# Patient Record
Sex: Male | Born: 1965 | Race: Black or African American | Hispanic: No | Marital: Single | Smoking: Heavy tobacco smoker
Health system: Southern US, Community
[De-identification: ages and names within clinical notes are randomized; demographics above are authoritative.]

## PROBLEM LIST (undated history)

## (undated) DIAGNOSIS — G8929 Other chronic pain: Secondary | ICD-10-CM

## (undated) DIAGNOSIS — M25569 Pain in unspecified knee: Secondary | ICD-10-CM

---

## 2001-10-30 ENCOUNTER — Emergency Department (HOSPITAL_COMMUNITY): Admission: EM | Admit: 2001-10-30 | Discharge: 2001-10-30 | Payer: Self-pay | Admitting: *Deleted

## 2003-09-23 ENCOUNTER — Emergency Department (HOSPITAL_COMMUNITY): Admission: EM | Admit: 2003-09-23 | Discharge: 2003-09-23 | Payer: Self-pay | Admitting: Emergency Medicine

## 2006-01-30 ENCOUNTER — Emergency Department (HOSPITAL_COMMUNITY): Admission: EM | Admit: 2006-01-30 | Discharge: 2006-01-30 | Payer: Self-pay | Admitting: Emergency Medicine

## 2006-03-06 ENCOUNTER — Emergency Department (HOSPITAL_COMMUNITY): Admission: EM | Admit: 2006-03-06 | Discharge: 2006-03-06 | Payer: Self-pay | Admitting: Emergency Medicine

## 2006-03-27 ENCOUNTER — Emergency Department (HOSPITAL_COMMUNITY): Admission: EM | Admit: 2006-03-27 | Discharge: 2006-03-27 | Payer: Self-pay | Admitting: Emergency Medicine

## 2006-03-28 ENCOUNTER — Emergency Department (HOSPITAL_COMMUNITY): Admission: EM | Admit: 2006-03-28 | Discharge: 2006-03-28 | Payer: Self-pay | Admitting: Emergency Medicine

## 2006-03-29 ENCOUNTER — Encounter (HOSPITAL_COMMUNITY): Admission: RE | Admit: 2006-03-29 | Discharge: 2006-04-28 | Payer: Self-pay | Admitting: General Surgery

## 2007-02-20 ENCOUNTER — Emergency Department (HOSPITAL_COMMUNITY): Admission: EM | Admit: 2007-02-20 | Discharge: 2007-02-20 | Payer: Self-pay | Admitting: Emergency Medicine

## 2008-05-25 ENCOUNTER — Emergency Department (HOSPITAL_COMMUNITY): Admission: EM | Admit: 2008-05-25 | Discharge: 2008-05-25 | Payer: Self-pay | Admitting: Emergency Medicine

## 2009-06-02 ENCOUNTER — Emergency Department (HOSPITAL_COMMUNITY): Admission: EM | Admit: 2009-06-02 | Discharge: 2009-06-02 | Payer: Self-pay | Admitting: Emergency Medicine

## 2011-07-21 LAB — CBC
HCT: 43.6
Hemoglobin: 14.3
MCHC: 32.8
MCV: 84.5
RBC: 5.16
RDW: 14.2

## 2011-07-21 LAB — DIFFERENTIAL
Basophils Relative: 1
Eosinophils Absolute: 0
Lymphs Abs: 1.8
Monocytes Relative: 9
Neutro Abs: 2.5
Neutrophils Relative %: 52

## 2011-07-21 LAB — COMPREHENSIVE METABOLIC PANEL
Albumin: 3.8
Alkaline Phosphatase: 56
BUN: 15
Creatinine, Ser: 1.43
Glucose, Bld: 104 — ABNORMAL HIGH
Total Protein: 6.6

## 2011-07-21 LAB — URINALYSIS, ROUTINE W REFLEX MICROSCOPIC
Bilirubin Urine: NEGATIVE
Glucose, UA: NEGATIVE
Hgb urine dipstick: NEGATIVE
Ketones, ur: NEGATIVE
Specific Gravity, Urine: 1.025
pH: 6

## 2011-07-21 LAB — LIPASE, BLOOD: Lipase: 21

## 2014-11-03 ENCOUNTER — Encounter (HOSPITAL_COMMUNITY): Payer: Self-pay | Admitting: Cardiology

## 2014-11-03 ENCOUNTER — Emergency Department (HOSPITAL_COMMUNITY)
Admission: EM | Admit: 2014-11-03 | Discharge: 2014-11-03 | Disposition: A | Discharge status: Home / Self Care | Payer: BLUE CROSS/BLUE SHIELD | Attending: Emergency Medicine | Admitting: Emergency Medicine

## 2014-11-03 DIAGNOSIS — Z720 Tobacco use: Secondary | ICD-10-CM | POA: Insufficient documentation

## 2014-11-03 DIAGNOSIS — Y9367 Activity, basketball: Secondary | ICD-10-CM | POA: Diagnosis not present

## 2014-11-03 DIAGNOSIS — S76012A Strain of muscle, fascia and tendon of left hip, initial encounter: Secondary | ICD-10-CM | POA: Insufficient documentation

## 2014-11-03 DIAGNOSIS — Y92310 Basketball court as the place of occurrence of the external cause: Secondary | ICD-10-CM | POA: Diagnosis not present

## 2014-11-03 DIAGNOSIS — X58XXXA Exposure to other specified factors, initial encounter: Secondary | ICD-10-CM | POA: Diagnosis not present

## 2014-11-03 DIAGNOSIS — T148XXA Other injury of unspecified body region, initial encounter: Secondary | ICD-10-CM

## 2014-11-03 DIAGNOSIS — Y998 Other external cause status: Secondary | ICD-10-CM | POA: Diagnosis not present

## 2014-11-03 DIAGNOSIS — S79912A Unspecified injury of left hip, initial encounter: Secondary | ICD-10-CM | POA: Diagnosis present

## 2014-11-03 MED ORDER — TRAMADOL HCL 50 MG PO TABS: 50.0000 mg | ORAL_TABLET | Freq: Four times a day (QID) | ORAL | Status: DC | PRN

## 2014-11-03 NOTE — ED Notes (Signed)
C/o pain to lower back,  Left hip and both knees since playing basketball Friday.

## 2014-11-03 NOTE — ED Provider Notes (Signed)
CSN: [id number]     Arrival date & time 11/03/14  0802 History   First MD Initiated Contact with Patient 11/03/14 0813     Chief Complaint  Patient presents with  . Back Pain     (Consider location/radiation/quality/duration/timing/severity/associated sxs/prior Treatment) HPI Comments: Pt comes in with c/o  Lower back, left hip and bilateral knee pain for 4 days. Pt states that he was playing basketball with a "bunch of 50 year old" and "i was doing things I had no business doing". He states that he hasn't taken anything for the symptoms. Denies numbness, weakness or incontinence. States that he has full rom  The history is provided by the patient. No language interpreter was used.    History reviewed. No pertinent past medical history. History reviewed. No pertinent past surgical history. History reviewed. No pertinent family history. History  Substance Use Topics  . Smoking status: Light Tobacco Smoker  . Smokeless tobacco: Not on file  . Alcohol Use: Yes     Comment: occasional    Review of Systems  All other systems reviewed and are negative.     Allergies  Review of patient's allergies indicates no known allergies.  Home Medications   Prior to Admission medications   Not on File   BP 129/89 mmHg  Pulse 86  Temp(Src) 98.3 F (36.8 C) (Oral)  Resp 18  Ht 6\' 3"  (1.905 m)  Wt 205 lb (92.987 kg)  BMI 25.62 kg/m2  SpO2 98% Physical Exam  Constitutional: He is oriented to person, place, and time. He appears well-developed and well-nourished.  Cardiovascular: Normal rate and regular rhythm.   Pulmonary/Chest: Effort normal and breath sounds normal.  Musculoskeletal: Normal range of motion.  Tender to the left lateral hip. No shortening or rotation. Pt has full rom. No swelling or deformity to the knees  Neurological: He is alert and oriented to person, place, and time. He exhibits normal muscle tone. Coordination normal.  Skin: Skin is warm and dry.  Psychiatric:  He has a normal mood and affect.  Nursing note and vitals reviewed.   ED Course  Procedures (including critical care time) Labs Review Labs Reviewed - No data to display  Imaging Review No results found.   EKG Interpretation None      MDM   Final diagnoses:  Muscle strain    dont think bony injury involved. Likely soreness related to overuse.considered rhabdo but doubt. Will give Leim Fabryultram    Doroteo Nickolson, NP 11/03/14 [id number]  Samuel JesterKathleen McManus, DO 11/05/14 1341

## 2014-11-03 NOTE — Discharge Instructions (Signed)

## 2016-02-10 ENCOUNTER — Emergency Department (HOSPITAL_COMMUNITY)
Admission: EM | Admit: 2016-02-10 | Discharge: 2016-02-10 | Disposition: A | Discharge status: Home / Self Care | Payer: Self-pay | Attending: Emergency Medicine | Admitting: Emergency Medicine

## 2016-02-10 ENCOUNTER — Encounter (HOSPITAL_COMMUNITY): Payer: Self-pay

## 2016-02-10 ENCOUNTER — Emergency Department (HOSPITAL_COMMUNITY): Payer: Self-pay

## 2016-02-10 DIAGNOSIS — F17210 Nicotine dependence, cigarettes, uncomplicated: Secondary | ICD-10-CM | POA: Insufficient documentation

## 2016-02-10 DIAGNOSIS — M2540 Effusion, unspecified joint: Secondary | ICD-10-CM

## 2016-02-10 DIAGNOSIS — M25462 Effusion, left knee: Secondary | ICD-10-CM | POA: Insufficient documentation

## 2016-02-10 DIAGNOSIS — M25562 Pain in left knee: Secondary | ICD-10-CM

## 2016-02-10 MED ORDER — HYDROCODONE-ACETAMINOPHEN 5-325 MG PO TABS: 1.0000 | ORAL_TABLET | Freq: Four times a day (QID) | ORAL | Status: DC | PRN

## 2016-02-10 MED ORDER — IBUPROFEN 800 MG PO TABS
800.0000 mg | ORAL_TABLET | Freq: Once | ORAL | Status: AC
  Administered 2016-02-10: 800 mg via ORAL
  Filled 2016-02-10: qty 1

## 2016-02-10 MED ORDER — HYDROCODONE-ACETAMINOPHEN 5-325 MG PO TABS
2.0000 | ORAL_TABLET | Freq: Once | ORAL | Status: AC
  Administered 2016-02-10: 2 via ORAL
  Filled 2016-02-10: qty 2

## 2016-02-10 MED ORDER — IBUPROFEN 800 MG PO TABS
800.0000 mg | ORAL_TABLET | Freq: Three times a day (TID) | ORAL | Status: DC | PRN
Start: 2016-02-10 — End: 2016-03-13

## 2016-02-10 NOTE — ED Notes (Signed)
Pt reports pain and swelling to his left knee x 1 day, denies recent injury or trauma, states "it just sometimes swells"

## 2016-02-10 NOTE — ED Provider Notes (Addendum)
TIME SEEN: 6:30 AM  CHIEF COMPLAINT: Left knee pain  HPI: Pt is a 50 y.o. male with history of tobacco use who presents to the emergency department with left knee pain and swelling for the past day. No known injury to the left knee. He states he has had problems with pain and swelling in this knee before and has never seen a physician for it. Does not have an orthopedic doctor. States that sometimes it will swell with over use like walking, playing basketball. Denies numbness, tingling or focal weakness. No history of PE or DVT. No redness or warmth. Not on anticoagulation. Denies any fever.  ROS: See HPI Constitutional: no fever  Eyes: no drainage  ENT: no runny nose   Cardiovascular:  no chest pain  Resp: no SOB  GI: no vomiting GU: no dysuria Integumentary: no rash  Allergy: no hives  Musculoskeletal: no leg swelling  Neurological: no slurred speech ROS otherwise negative  PAST MEDICAL HISTORY/PAST SURGICAL HISTORY:  History reviewed. No pertinent past medical history.  MEDICATIONS:  Prior to Admission medications   Medication Sig Start Date End Date Taking? Authorizing Provider  traMADol (ULTRAM) 50 MG tablet Take 1 tablet (50 mg total) by mouth every 6 (six) hours as needed. 11/03/14   Teressa LowerVrinda Pickering, NP    ALLERGIES:  No Known Allergies  SOCIAL HISTORY:  Social History  Substance Use Topics  . Smoking status: Light Tobacco Smoker  . Smokeless tobacco: Not on file  . Alcohol Use: Yes     Comment: occasional    FAMILY HISTORY: No family history on file.  EXAM: BP 117/69 mmHg  Pulse 62  Temp(Src) 98.5 F (36.9 C) (Oral)  Resp 18  Ht 6\' 2"  (1.88 m)  Wt 195 lb (88.451 kg)  BMI 25.03 kg/m2  SpO2 100% CONSTITUTIONAL: Alert and oriented and responds appropriately to questions. Well-appearing; well-nourished HEAD: Normocephalic EYES: Conjunctivae clear, PERRL ENT: normal nose; no rhinorrhea; moist mucous membranes NECK: Supple, no meningismus, no LAD  CARD:  RRR; S1 and S2 appreciated; no murmurs, no clicks, no rubs, no gallops RESP: Normal chest excursion without splinting or tachypnea; breath sounds clear and equal bilaterally; no wheezes, no rhonchi, no rales, no hypoxia or respiratory distress, speaking full sentences ABD/GI: Normal bowel sounds; non-distended; soft, non-tender, no rebound, no guarding, no peritoneal signs BACK:  The back appears normal and is non-tender to palpation, there is no CVA tenderness EXT: Patient has tenderness over the medial lateral joint line of the left knee with a moderate joint effusion. There is absolutely no erythema, warmth. He has no ligamentous laxity on exam. He does have pain with flexion of the knee but has full range of motion in this joint. No bony tenderness on exam or bony deformity. 2+ DP pulses bilaterally. Normal ROM in all joints; otherwise extreme his arm non-tender to palpation; no edema; normal capillary refill; no cyanosis, no calf tenderness or swelling; compartments are soft    SKIN: Normal color for age and race; warm; no rash NEURO: Moves all extremities equally, sensation to light touch intact diffusely, cranial nerves II through XII intact PSYCH: The patient's mood and manner are appropriate. Grooming and personal hygiene are appropriate.  MEDICAL DECISION MAKING: Patient here with left knee pain and moderate joint effusion. Suspect arthritis, meniscal injury, sprain. Nothing to suggest septic arthritis. Doubt DVT. Neurovascular intact distally. We'll obtain an x-ray as he has never had an x-ray of this knee before. Will give crutches to help him ambulate.  We'll place an Ace wrap to help with swelling.  Have advised him to buy a knee sleeve.  I advised him to keep it elevated, apply ice Will treat pain with Vicodin, ibuprofen. Anticipate follow-up with an orthopedic physician as an outpatient.  ED PROGRESS: X-ray shows minimal arthritic changes and large joint effusion. Again nothing to suggest  septic arthritis, gout or hemarthrosis. Suspect effusion is reactive from sprain, arthritic changes. Have recommended close outpatient orthopedic follow-up. Have recommended ice, elevation, rest, NSAIDs. Discussed return precautions.  We'll discharge with Vicodin, ibuprofen. He is comfortable with this plan.     At this time, I do not feel there is any life-threatening condition present. I have reviewed and discussed all results (EKG, imaging, lab, urine as appropriate), exam findings with patient. I have reviewed nursing notes and appropriate previous records.  I feel the patient is safe to be discharged home without further emergent workup. Discussed usual and customary return precautions. Patient and family (if present) verbalize understanding and are comfortable with this plan.  Patient will follow-up with their primary care provider. If they do not have a primary care provider, information for follow-up has been provided to them. All questions have been answered.       Layla Maw Fannye Myer, DO 02/10/16 [phone]

## 2016-02-10 NOTE — ED Notes (Signed)
MD at bedside. 

## 2016-02-10 NOTE — Discharge Instructions (Signed)
How to Use a Knee Brace °A knee brace is a device that you wear to support your knee, especially if the knee is healing after an injury or surgery. There are several types of knee braces. Some are designed to prevent an injury (prophylactic brace). These are often worn during sports. Others support an injured knee (functional brace) or keep it still while it heals (rehabilitative brace). People with severe arthritis of the knee may benefit from a brace that takes some pressure off the knee (unloader brace). Most knee braces are made from a combination of cloth and metal or plastic.  °You may need to wear a knee brace to: °· Relieve knee pain. °· Help your knee support your weight (improve stability). °· Help you walk farther (improve mobility). °· Prevent injury. °· Support your knee while it heals from surgery or from an injury. °RISKS AND COMPLICATIONS °Generally, knee braces are very safe to wear. However, problems may occur, including: °· Skin irritation that may lead to infection. °· Making your condition worse if you wear the brace in the wrong way. °HOW TO USE A KNEE BRACE °Different braces will have different instructions for use. Your health care provider will tell you or show you: °· How to put on your brace. °· How to adjust the brace. °· When and how often to wear the brace. °· How to remove the brace. °· If you will need any assistive devices in addition to the brace, such as crutches or a cane. °In general, your brace should: °· Have the hinge of the brace line up with the bend of your knee. °· Have straps, hooks, or tapes that fasten snugly around your leg. °· Not feel too tight or too loose.  °HOW TO CARE FOR A KNEE BRACE °· Check your brace often for signs of damage, such as loose connections or attachments. Your knee brace may get damaged or wear out during normal use. °· Wash the fabric parts of your brace with soap and water. °· Read the insert that comes with your brace for other specific care  instructions.  °SEEK MEDICAL CARE IF: °· Your knee brace is too loose or too tight and you cannot adjust it. °· Your knee brace causes skin redness, swelling, bruising, or irritation. °· Your knee brace is not helping. °· Your knee brace is making your knee pain worse. °  °This information is not intended to replace advice given to you by your health care provider. Make sure you discuss any questions you have with your health care provider. °  °Document Released: 12/30/2003 Document Revised: 06/30/2015 Document Reviewed: 02/01/2015 °Elsevier Interactive Patient Education ©2016 [employer] ° °Knee Pain °Knee pain is a very common symptom and can have many causes. Knee pain often goes away when you follow your health care provider's instructions for relieving pain and discomfort at home. However, knee pain can develop into a condition that needs treatment. Some conditions may include: °· Arthritis caused by wear and tear (osteoarthritis). °· Arthritis caused by swelling and irritation (rheumatoid arthritis or gout). °· A cyst or growth in your knee. °· An infection in your knee joint. °· An injury that will not heal. °· Damage, swelling, or irritation of the tissues that support your knee (torn ligaments or tendinitis). °If your knee pain continues, additional tests may be ordered to diagnose your condition. Tests may include X-rays or other imaging studies of your knee. You may also need to have fluid removed from your knee. Treatment for   ongoing knee pain depends on the cause, but treatment may include: °· Medicines to relieve pain or swelling. °· Steroid injections in your knee. °· Physical therapy. °· Surgery. °HOME CARE INSTRUCTIONS °· Take medicines only as directed by your health care provider. °· Rest your knee and keep it raised (elevated) while you are resting. °· Do not do things that cause or worsen pain. °· Avoid high-impact activities or exercises, such as running, jumping rope, or doing jumping  jacks. °· Apply ice to the knee area: °· Put ice in a plastic bag. °· Place a towel between your skin and the bag. °· Leave the ice on for 20 minutes, 2-3 times a day. °· Ask your health care provider if you should wear an elastic knee support. °· Keep a pillow under your knee when you sleep. °· Lose weight if you are overweight. Extra weight can put pressure on your knee. °· Do not use any tobacco products, including cigarettes, chewing tobacco, or electronic cigarettes. If you need help quitting, ask your health care provider. Smoking may slow the healing of any bone and joint problems that you may have. °SEEK MEDICAL CARE IF: °· Your knee pain continues, changes, or gets worse. °· You have a fever along with knee pain. °· Your knee buckles or locks up. °· Your knee becomes more swollen. °SEEK IMMEDIATE MEDICAL CARE IF:  °· Your knee joint feels hot to the touch. °· You have chest pain or trouble breathing. °  °This information is not intended to replace advice given to you by your health care provider. Make sure you discuss any questions you have with your health care provider. °  °Document Released: 08/06/2007 Document Revised: 10/30/2014 Document Reviewed: 05/25/2014 °Elsevier Interactive Patient Education ©2016 [employer] ° ° °RICE for Routine Care of Injuries °The routine care of many injuries includes rest, ice, compression, and elevation (RICE therapy). RICE therapy is often recommended for injuries to soft tissues, such as a muscle strain, ligament injuries, bruises, and overuse injuries. It can also be used for some bony injuries. Using RICE therapy can help to relieve pain, lessen swelling, and enable your body to heal. °Rest °Rest is required to allow your body to heal. This usually involves reducing your normal activities and avoiding use of the injured part of your body. Generally, you can return to your normal activities when you are comfortable and have been given permission by your health care  provider. °Ice °Icing your injury helps to keep the swelling down, and it lessens pain. Do not apply ice directly to your skin. °· Put ice in a plastic bag. °· Place a towel between your skin and the bag. °· Leave the ice on for 20 minutes, 2-3 times a day. °Do this for as long as you are directed by your health care provider. °Compression °Compression means putting pressure on the injured area. Compression helps to keep swelling down, gives support, and helps with discomfort. Compression may be done with an elastic bandage. If an elastic bandage has been applied, follow these general tips: °· Remove and reapply the bandage every 3-4 hours or as directed by your health care provider. °· Make sure the bandage is not wrapped too tightly, because this can cut off circulation. If part of your body beyond the bandage becomes blue, numb, cold, swollen, or more painful, your bandage is most likely too tight. If this occurs, remove your bandage and reapply it more loosely. °· See your health care provider if the bandage seems to be   making your problems worse rather than better. °Elevation °Elevation means keeping the injured area raised. This helps to lessen swelling and decrease pain. If possible, your injured area should be elevated at or above the level of your heart or the center of your chest. °WHEN SHOULD I SEEK MEDICAL CARE? °You should seek medical care if: °· Your pain and swelling continue. °· Your symptoms are getting worse rather than improving. °These symptoms may indicate that further evaluation or further X-rays are needed. Sometimes, X-rays may not show a small broken bone (fracture) until a number of days later. Make a follow-up appointment with your health care provider. °WHEN SHOULD I SEEK IMMEDIATE MEDICAL CARE? °You should seek immediate medical care if: °· You have sudden severe pain at or below the area of your injury. °· You have redness or increased swelling around your injury. °· You have tingling  or numbness at or below the area of your injury that does not improve after you remove the elastic bandage. °  °This information is not intended to replace advice given to you by your health care provider. Make sure you discuss any questions you have with your health care provider. °  °Document Released: 01/21/2001 Document Revised: 06/30/2015 Document Reviewed: 09/16/2014 °Elsevier Interactive Patient Education ©2016 [employer] ° °

## 2016-03-13 ENCOUNTER — Encounter (HOSPITAL_COMMUNITY): Payer: Self-pay

## 2016-03-13 ENCOUNTER — Emergency Department (HOSPITAL_COMMUNITY)
Admission: EM | Admit: 2016-03-13 | Discharge: 2016-03-13 | Disposition: A | Discharge status: Home / Self Care | Payer: BLUE CROSS/BLUE SHIELD | Attending: Emergency Medicine | Admitting: Emergency Medicine

## 2016-03-13 DIAGNOSIS — Z791 Long term (current) use of non-steroidal anti-inflammatories (NSAID): Secondary | ICD-10-CM | POA: Insufficient documentation

## 2016-03-13 DIAGNOSIS — F17210 Nicotine dependence, cigarettes, uncomplicated: Secondary | ICD-10-CM | POA: Insufficient documentation

## 2016-03-13 DIAGNOSIS — Z7982 Long term (current) use of aspirin: Secondary | ICD-10-CM | POA: Insufficient documentation

## 2016-03-13 DIAGNOSIS — M25462 Effusion, left knee: Secondary | ICD-10-CM | POA: Insufficient documentation

## 2016-03-13 MED ORDER — ONDANSETRON HCL 4 MG PO TABS
4.0000 mg | ORAL_TABLET | Freq: Once | ORAL | Status: AC
  Administered 2016-03-13: 4 mg via ORAL
  Filled 2016-03-13: qty 1

## 2016-03-13 MED ORDER — HYDROCODONE-ACETAMINOPHEN 5-325 MG PO TABS
2.0000 | ORAL_TABLET | Freq: Once | ORAL | Status: AC
  Administered 2016-03-13: 2 via ORAL
  Filled 2016-03-13: qty 2

## 2016-03-13 MED ORDER — DEXAMETHASONE SODIUM PHOSPHATE 4 MG/ML IJ SOLN
4.0000 mg | Freq: Once | INTRAMUSCULAR | Status: AC
  Administered 2016-03-13: 4 mg via INTRA_ARTICULAR
  Filled 2016-03-13: qty 1

## 2016-03-13 MED ORDER — BUPIVACAINE HCL (PF) 0.25 % IJ SOLN
10.0000 mL | Freq: Once | INTRAMUSCULAR | Status: AC
  Administered 2016-03-13: 10 mL
  Filled 2016-03-13: qty 30

## 2016-03-13 MED ORDER — IBUPROFEN 800 MG PO TABS
800.0000 mg | ORAL_TABLET | Freq: Once | ORAL | Status: AC
  Administered 2016-03-13: 800 mg via ORAL
  Filled 2016-03-13: qty 1

## 2016-03-13 MED ORDER — HYDROCODONE-ACETAMINOPHEN 5-325 MG PO TABS: 1.0000 | ORAL_TABLET | ORAL | Status: AC | PRN

## 2016-03-13 MED ORDER — IBUPROFEN 800 MG PO TABS: 800.0000 mg | ORAL_TABLET | Freq: Three times a day (TID) | ORAL | Status: AC

## 2016-03-13 NOTE — ED Notes (Signed)
Pt alert & oriented x4, stable gait. Patient given discharge instructions, paperwork & prescription(s). Patient informed not to drive, operate any equipment & handel any important documents 4 hours after taking pain medication. Patient  instructed to stop at the registration desk to finish any additional paperwork. Patient  verbalized understanding. Pt left department w/ no further questions. 

## 2016-03-13 NOTE — Discharge Instructions (Signed)
Knee Effusion PLEASE SEE DR HARRISON OR THE ORTHOPEDIC OF YOUR CHOICE FOR EVALUATION OF YOUR KNEE SWELLING/EFFUSION. USE DECADRON AND DICLOFENAC DAILY WITH FOOD.  Knee Effusion Knee effusion means that you have extra fluid in your knee. This can cause pain. Your knee may be more difficult to bend and move. HOME CARE  Use crutches as told by your doctor.  Wear a knee brace as told by your doctor.  Apply ice to the swollen area:  Put ice in a plastic bag.  Place a towel between your skin and the bag.  Leave the ice on for 20 minutes, 2-3 times per day.  Keep your knee raised (elevated) when you are sitting or lying down.  Take medicines only as told by your doctor.  Do any rehabilitation or strengthening exercises as told by your doctor.  Rest your knee as told by your doctor. You may start doing your normal activities again when your doctor says it is okay.  Keep all follow-up visits as told by your doctor. This is important. GET HELP IF:   You continue to have pain in your knee. GET HELP RIGHT AWAY IF:  You have increased swelling or redness of your knee.  You have severe pain in your knee.  You have a fever.   This information is not intended to replace advice given to you by your health care provider. Make sure you discuss any questions you have with your health care provider.   Document Released: 11/11/2010 Document Revised: 10/30/2014 Document Reviewed: 05/25/2014 Elsevier Interactive Patient Education 2016 [employer] Knee effusion means that you have extra fluid in your knee. This can cause pain. Your knee may be more difficult to bend and move. HOME CARE  Use crutches as told by your doctor.  Wear a knee brace as told by your doctor.  Apply ice to the swollen area:  Put ice in a plastic bag.  Place a towel between your skin and the bag.  Leave the ice on for 20 minutes, 2-3 times per day.  Keep your knee raised (elevated) when you are sitting or  lying down.  Take medicines only as told by your doctor.  Do any rehabilitation or strengthening exercises as told by your doctor.  Rest your knee as told by your doctor. You may start doing your normal activities again when your doctor says it is okay.  Keep all follow-up visits as told by your doctor. This is important. GET HELP IF:   You continue to have pain in your knee. GET HELP RIGHT AWAY IF:  You have increased swelling or redness of your knee.  You have severe pain in your knee.  You have a fever.   This information is not intended to replace advice given to you by your health care provider. Make sure you discuss any questions you have with your health care provider.   Document Released: 11/11/2010 Document Revised: 10/30/2014 Document Reviewed: 05/25/2014 Elsevier Interactive Patient Education [employer]

## 2016-03-13 NOTE — ED Provider Notes (Signed)
CSN: [id number]     Arrival date & time 03/13/16  1731 History  By signing my name below, I, Mesha Guinyard, attest that this documentation has been prepared under the direction and in the presence of Treatment Team:  Attending Provider: Vanetta MuldersScott Zackowski, MD Physician Assistant: Ivery QualeHobson Malyiah Fellows, PA-C.  Electronically Signed: Arvilla MarketMesha Guinyard, Medical Scribe. 03/13/2016. 6:28 PM.   Chief Complaint  Patient presents with  . Knee Pain   The history is provided by the patient. No language interpreter was used.    HPI Comments: Marco Perez is a 50 y.o. 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 He states that he is having associated symptoms of edema, and pain. Pt was referred to an orthopedic in the past but never went because he didn't have insurance or the money for it. Pt hasn't had any injury to his knee since the last time he was seen.   History reviewed. No pertinent past medical history. History reviewed. No pertinent past surgical history. No family history on file. Social History  Substance Use Topics  . Smoking status: Heavy Tobacco Smoker -- 0.50 packs/day    Types: Cigarettes  . Smokeless tobacco: None  . Alcohol Use: Yes     Comment: occasional    Review of Systems  Musculoskeletal: Positive for joint swelling and arthralgias.  All other systems reviewed and are negative.   Allergies  Review of patient's allergies indicates no known allergies.  Home Medications   Prior to Admission medications   Medication Sig Start Date End Date Taking? Authorizing Provider  aspirin EC 325 MG tablet Take 325 mg by mouth every 6 (six) hours as needed for mild pain or moderate pain.   Yes Historical Provider, MD  HYDROcodone-acetaminophen (NORCO/VICODIN) 5-325 MG tablet Take 1-2 tablets by mouth every 6 (six) hours as needed. Patient not taking: Reported on  03/13/2016 02/10/16   Kristen N Ward, DO  ibuprofen (ADVIL,MOTRIN) 800 MG tablet Take 1 tablet (800 mg total) by mouth every 8 (eight) hours as needed for mild pain. Patient not taking: Reported on 03/13/2016 02/10/16   Kristen N Ward, DO   BP 120/57 mmHg  Pulse 103  Temp(Src) 98.7 F (37.1 C) (Oral)  Resp 16  Ht 6\' 2"  (1.88 m)  Wt 198 lb (89.812 kg)  BMI 25.41 kg/m2  SpO2 100% Physical Exam  Constitutional: He is oriented to person, place, and time. He appears well-developed and well-nourished.  Non-toxic appearance.  HENT:  Head: Normocephalic.  Right Ear: Tympanic membrane and external ear normal.  Left Ear: Tympanic membrane and external ear normal.  Eyes: EOM and lids are normal. Pupils are equal, round, and reactive to light.  Neck: Normal range of motion. Neck supple. Carotid bruit is not present.  Cardiovascular: Normal rate, regular rhythm, normal heart sounds, intact distal pulses and normal pulses.   Pulmonary/Chest: Breath sounds normal. No respiratory distress.  Abdominal: Soft. Bowel sounds are normal. There is no tenderness. There is no guarding.  Musculoskeletal: Normal range of motion.  Left knee is warm but not hot  Effusion present Patella is in the mid line No posterior mass No deformity of the patellar tendon, or the quadricep area Dorsalis pedis is 2+  Lymphadenopathy:       Head (right side): No submandibular adenopathy present.       Head (left side): No submandibular  adenopathy present.    He has no cervical adenopathy.  Neurological: He is alert and oriented to person, place, and time. He has normal strength. No cranial nerve deficit or sensory deficit.  Skin: Skin is warm and dry.  Psychiatric: He has a normal mood and affect. His speech is normal.  Nursing note and vitals reviewed.   ED Course Attempt to drain the effusion of the knee had to be aborted, pt not cooperative.  Procedures  DIAGNOSTIC STUDIES: Oxygen Saturation is 100% on RA, NL by my  interpretation.    COORDINATION OF CARE: 6:28 PM Discussed treatment plan with pt at bedside and pt agreed to plan.  MDM Effusion of the left knee present. I reviewed the Xray of April 2017 and found pt to have effusion and medial and lateral compartment narrowing present. Pt again encouraged to see orthopedic for evaluation as soon as possible. Rx for norco and ibuprofen given. Pt treated with IM decadron.   Final diagnoses:  Knee effusion, left    **I have reviewed nursing notes, vital signs, and all appropriate lab and imaging results for this patient.*  **I personally performed the services described in this documentation, which was scribed in my presence. The recorded information has been reviewed and is accurate.Ivery Quale, PA-C 03/13/16 2033  Vanetta Mulders, MD 03/14/16 [phone]

## 2016-03-13 NOTE — ED Notes (Signed)
Pt reports left knee started hurting 3 weeks ago, but pain increased 2 days ago. Difficulty bearing weight. No known injury. Utilizing crutches

## 2016-03-13 NOTE — ED Notes (Signed)
Pt returned to ER for left knee pain again. No new injury. Did not follow up w/ ortho, states he has no money for follow up. Wanting something for pain. Pt still using crutches but does not have immobilizer on at present.

## 2016-03-14 ENCOUNTER — Telehealth: Payer: Self-pay | Admitting: Orthopedic Surgery

## 2016-03-14 NOTE — Telephone Encounter (Signed)
Patient called this morning and wanted to schedule an appointment.  I offered later this week but he said he has no money now and doesn't want to come this week.  I offered several days and he said he could only come on Wednesdays or Friday.  I again offered dates and he requested to wait until March 29, 2016 at 3":40 with Dr. Romeo AppleHarrison.  I will mail out paperwork today

## 2016-03-15 ENCOUNTER — Emergency Department (HOSPITAL_COMMUNITY)
Admission: EM | Admit: 2016-03-15 | Discharge: 2016-03-15 | Disposition: A | Discharge status: Home / Self Care | Payer: Self-pay | Attending: Emergency Medicine | Admitting: Emergency Medicine

## 2016-03-15 ENCOUNTER — Encounter (HOSPITAL_COMMUNITY): Payer: Self-pay | Admitting: Emergency Medicine

## 2016-03-15 DIAGNOSIS — F17210 Nicotine dependence, cigarettes, uncomplicated: Secondary | ICD-10-CM | POA: Insufficient documentation

## 2016-03-15 DIAGNOSIS — Z791 Long term (current) use of non-steroidal anti-inflammatories (NSAID): Secondary | ICD-10-CM | POA: Insufficient documentation

## 2016-03-15 DIAGNOSIS — Z7982 Long term (current) use of aspirin: Secondary | ICD-10-CM | POA: Insufficient documentation

## 2016-03-15 DIAGNOSIS — M25462 Effusion, left knee: Secondary | ICD-10-CM | POA: Insufficient documentation

## 2016-03-15 DIAGNOSIS — Z0289 Encounter for other administrative examinations: Secondary | ICD-10-CM | POA: Insufficient documentation

## 2016-03-15 HISTORY — DX: Pain in unspecified knee: M25.569

## 2016-03-15 HISTORY — DX: Other chronic pain: G89.29

## 2016-03-15 NOTE — ED Notes (Signed)
Pt c/o continued left knee pain. Pt here for extended work note to return on Monday.

## 2016-03-15 NOTE — Discharge Instructions (Signed)
Please continue to use the knee immobilizer and crutches on. Please continue to use the ibuprofen every 6 hours for inflammation, swelling, and pain. Please see the orthopedic specialist as scheduled. Knee Effusion Knee effusion means that you have extra fluid in your knee. This can cause pain. Your knee may be more difficult to bend and move. HOME CARE  Use crutches as told by your doctor.  Wear a knee brace as told by your doctor.  Apply ice to the swollen area:  Put ice in a plastic bag.  Place a towel between your skin and the bag.  Leave the ice on for 20 minutes, 2-3 times per day.  Keep your knee raised (elevated) when you are sitting or lying down.  Take medicines only as told by your doctor.  Do any rehabilitation or strengthening exercises as told by your doctor.  Rest your knee as told by your doctor. You may start doing your normal activities again when your doctor says it is okay.  Keep all follow-up visits as told by your doctor. This is important. GET HELP IF:   You continue to have pain in your knee. GET HELP RIGHT AWAY IF:  You have increased swelling or redness of your knee.  You have severe pain in your knee.  You have a fever.   This information is not intended to replace advice given to you by your health care provider. Make sure you discuss any questions you have with your health care provider.   Document Released: 11/11/2010 Document Revised: 10/30/2014 Document Reviewed: 05/25/2014 Elsevier Interactive Patient Education [employer]2016 [employer]

## 2016-03-15 NOTE — ED Provider Notes (Signed)
CSN: [id number]     Arrival date & time 03/15/16  1743 History   First MD Initiated Contact with Patient 03/15/16 1851     Chief Complaint  Patient presents with  . Knee Pain     (Consider location/radiation/quality/duration/timing/severity/associated sxs/prior Treatment) HPI Comments: Patient is a 50 year old male who presents to the emergency department with a complaint of knee pain, and "I need an extension on my work excuse".  The patient was seen in the emergency department on May 22, at which time he was noted to have an effusion of the left knee. His x-rays were assessed from previous visits, and it is noted that he has osteoarthritis involving the knee. The thumb patient was strongly encouraged to see orthopedics for evaluation of the on effusion. The patient states that he is not able to go to the doctor's office tubal weeks. He requests to have a extension of his work note from the previous emergency department visit. There's been no fever or chills reported. No red streaks from the knee. The patient is using a crutch to get around.  Patient is a 50 y.o. male presenting with knee pain. The history is provided by the patient.  Knee Pain Location:  Knee Knee location:  L knee   Past Medical History  Diagnosis Date  . Chronic knee pain    History reviewed. No pertinent past surgical history. No family history on file. Social History  Substance Use Topics  . Smoking status: Heavy Tobacco Smoker -- 0.50 packs/day    Types: Cigarettes  . Smokeless tobacco: None  . Alcohol Use: Yes     Comment: occasional    Review of Systems    Allergies  Review of patient's allergies indicates no known allergies.  Home Medications   Prior to Admission medications   Medication Sig Start Date End Date Taking? Authorizing Provider  aspirin EC 325 MG tablet Take 325 mg by mouth every 6 (six) hours as needed for mild pain or moderate pain.    Historical Provider, MD   HYDROcodone-acetaminophen (NORCO/VICODIN) 5-325 MG tablet Take 1 tablet by mouth every 4 (four) hours as needed. 03/13/16   Ivery QualeHobson Xena Propst, PA-C  ibuprofen (ADVIL,MOTRIN) 800 MG tablet Take 1 tablet (800 mg total) by mouth 3 (three) times daily. 03/13/16   Ivery QualeHobson Dashanti Burr, PA-C   BP 117/65 mmHg  Pulse 80  Temp(Src) 98.6 F (37 C) (Oral)  Resp 18  Ht 6\' 2"  (1.88 m)  Wt 88.451 kg  BMI 25.03 kg/m2  SpO2 100% Physical Exam  Constitutional: He is oriented to person, place, and time. He appears well-developed and well-nourished.  Non-toxic appearance.  HENT:  Head: Normocephalic.  Right Ear: Tympanic membrane and external ear normal.  Left Ear: Tympanic membrane and external ear normal.  Eyes: EOM and lids are normal. Pupils are equal, round, and reactive to light.  Neck: Normal range of motion. Neck supple. Carotid bruit is not present.  Cardiovascular: Normal rate, regular rhythm, normal heart sounds, intact distal pulses and normal pulses.   Pulmonary/Chest: Breath sounds normal. No respiratory distress.  Abdominal: Soft. Bowel sounds are normal. There is no tenderness. There is no guarding.  Musculoskeletal: Normal range of motion.  The knee effusion has improved from previous emergency department visit, but some residual effusion is still present. There is some crepitus of the left knee. The knee is not hot on, and there is no increased redness appreciated. There is no posterior mass of the knee.  Lymphadenopathy:  Head (right side): No submandibular adenopathy present.       Head (left side): No submandibular adenopathy present.    He has no cervical adenopathy.  Neurological: He is alert and oriented to person, place, and time. He has normal strength. No cranial nerve deficit or sensory deficit.  Skin: Skin is warm and dry.  Psychiatric: He has a normal mood and affect. His speech is normal.  Nursing note and vitals reviewed.   ED Course  Procedures (including critical care  time) Labs Review Labs Reviewed - No data to display  Imaging Review No results found. I have personally reviewed and evaluated these images and lab results as part of my medical decision-making.   EKG Interpretation None      MDM  Vital signs reviewed. In comparison to the examination of May 22, the effusion is getting better on, but not completely resolved.  At the patient's request his return to work is extended to Tuesday, May 30. The patient has an appointment scheduled with orthopedics.    Final diagnoses:  Knee effusion, left  Encounter to obtain excuse from work    *I have reviewed nursing notes, vital signs, and all appropriate lab and imaging results for this patient.[address], PA-C 03/15/16 1952  Bethann Berkshire, MD 03/15/16 2252

## 2016-03-29 ENCOUNTER — Ambulatory Visit: Payer: Self-pay | Admitting: Orthopedic Surgery

## 2017-01-06 IMAGING — DX DG KNEE COMPLETE 4+V*L*
4 series · 4 of 4 positions shown · non-contrast
Comparison: None.

CLINICAL DATA: Pt. Is having left knee pain and swelling x 1 day
and worse this am. No known injury.

EXAM:
LEFT KNEE - COMPLETE 4+ VIEW

[knee ap (1 of 3)]
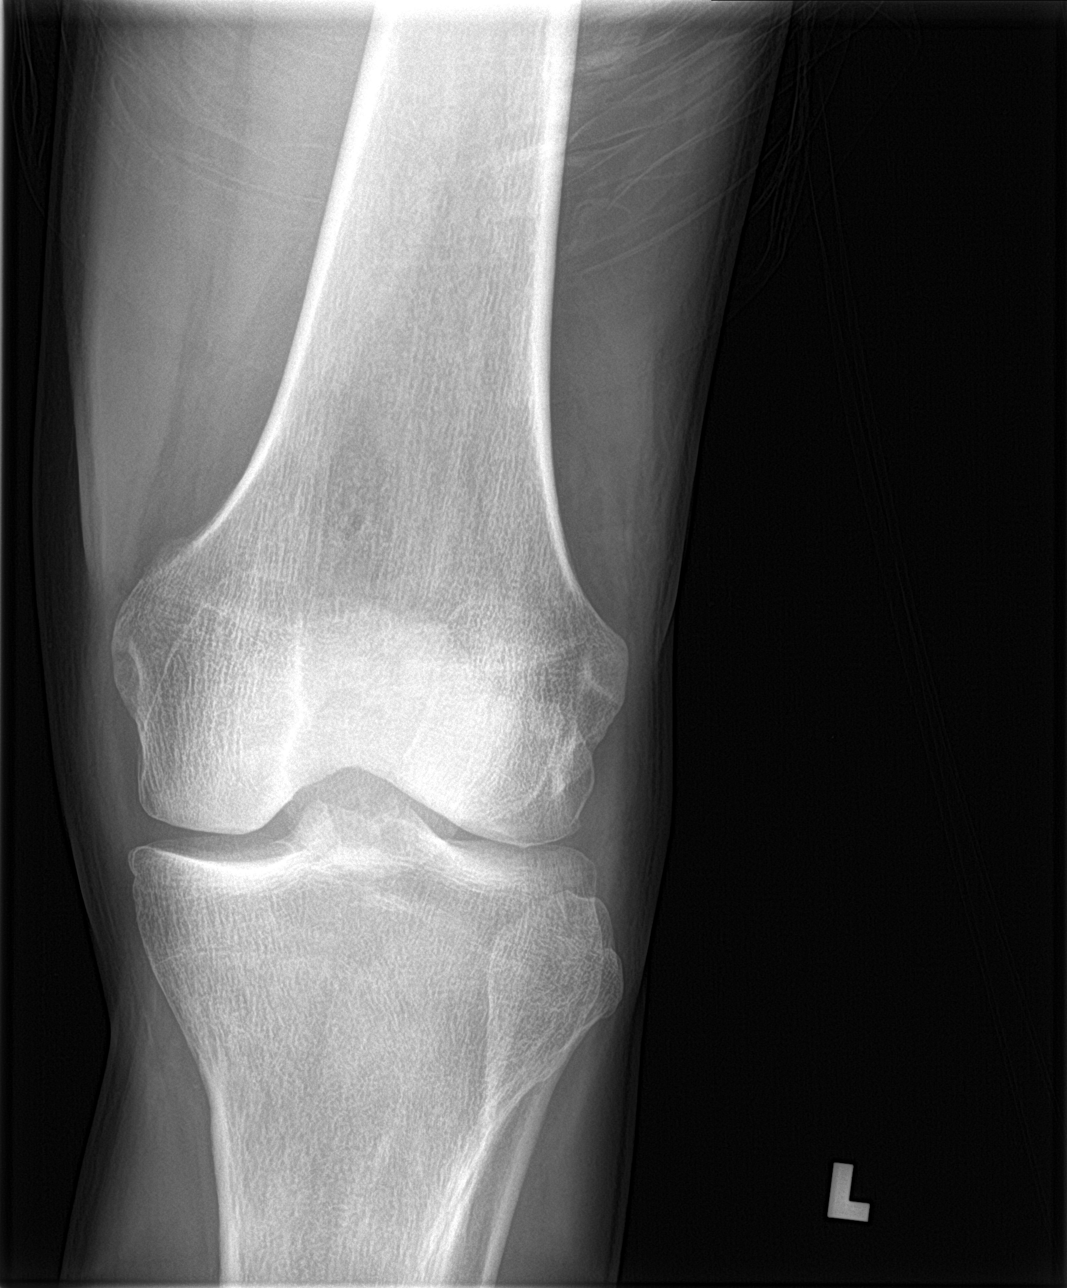

[knee lat]
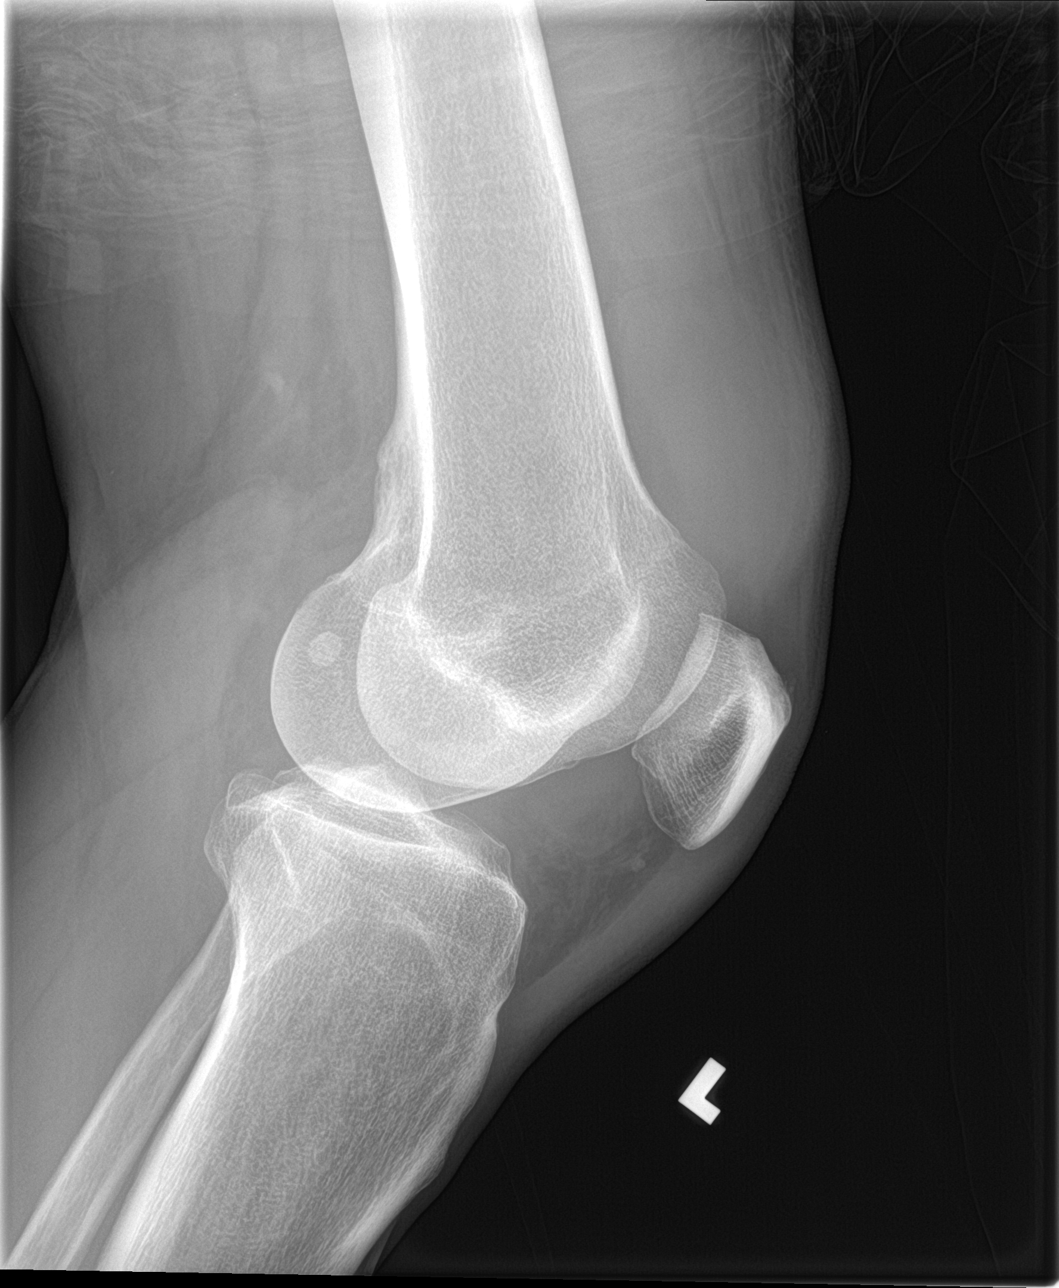

[knee ap (2 of 3)]
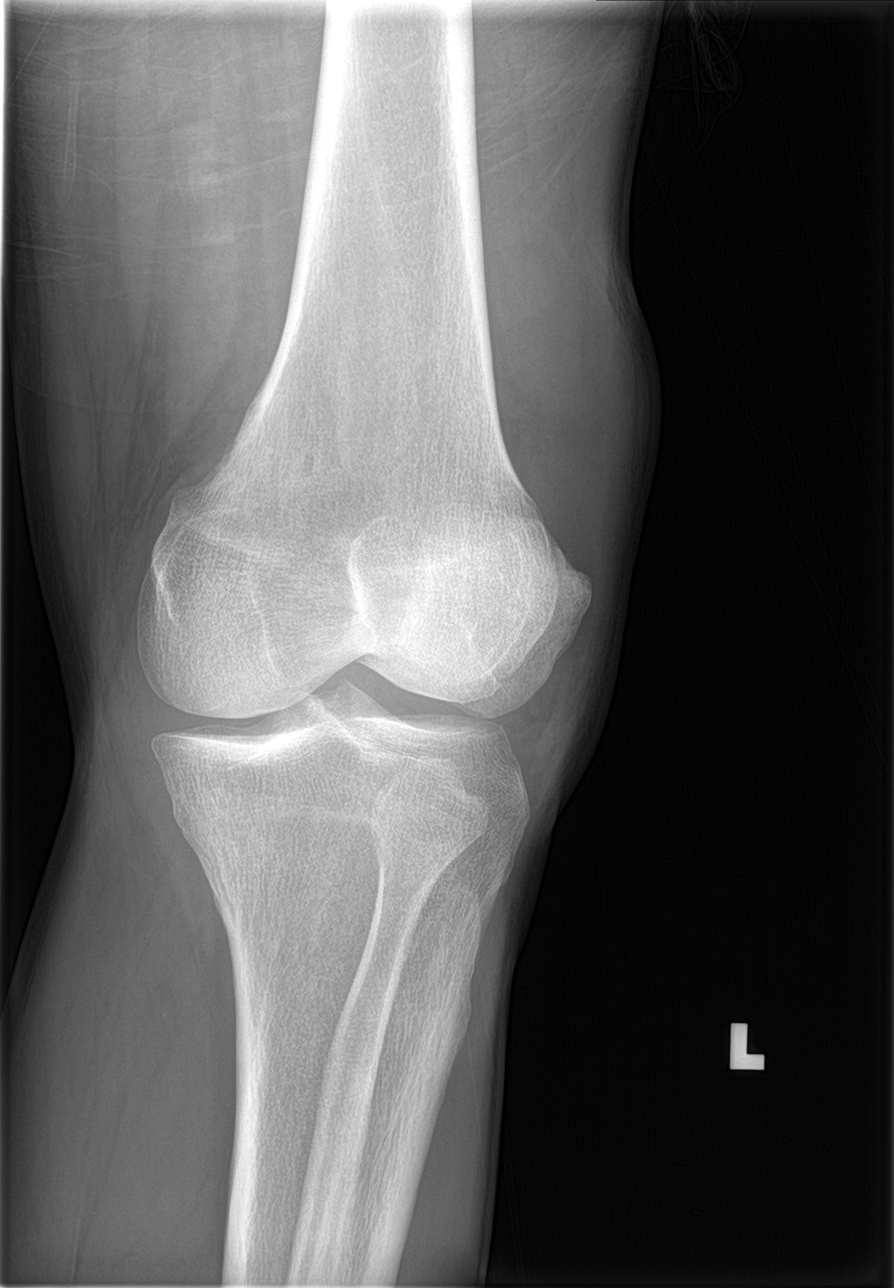

[knee ap (3 of 3)]
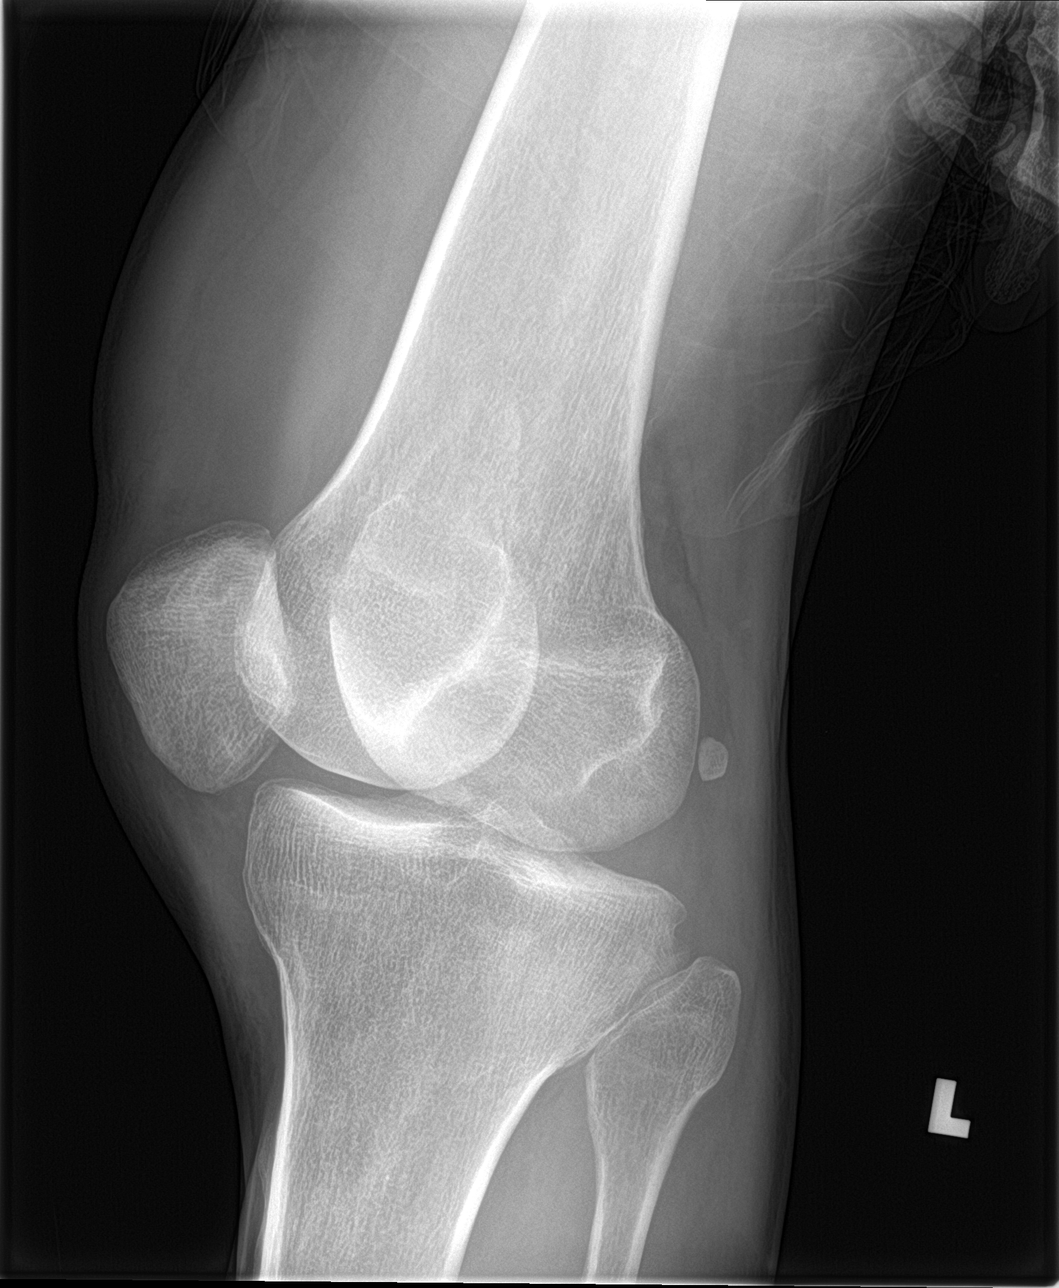

[4 of 4 positions shown; findings below may reference images not displayed]

FINDINGS: Minimal narrowing of the medial and lateral compartments. No
fracture or dislocation. No focal osseous abnormalities. Large joint
effusion.
IMPRESSION: Minimal arthritic change.  Large joint effusion.
